# Patient Record
Sex: Male | Born: 2000 | Race: Black or African American | Hispanic: No | Marital: Single | Smoking: Never smoker
Health system: Southern US, Community
[De-identification: ages and names within clinical notes are randomized; demographics above are authoritative.]

## PROBLEM LIST (undated history)

## (undated) DIAGNOSIS — F909 Attention-deficit hyperactivity disorder, unspecified type: Secondary | ICD-10-CM

## (undated) HISTORY — PX: NO PAST SURGERIES: SHX2092

---
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
## 2023-03-31 IMAGING — CR DG WRIST COMPLETE 3+V*R*
4 series · 4 of 4 positions shown · non-contrast
Comparison: No pertinent prior exams available for comparison.

CLINICAL DATA: Pain and swelling after impact.

EXAM:
RIGHT WRIST - COMPLETE 3+ VIEW

[wrist pa]
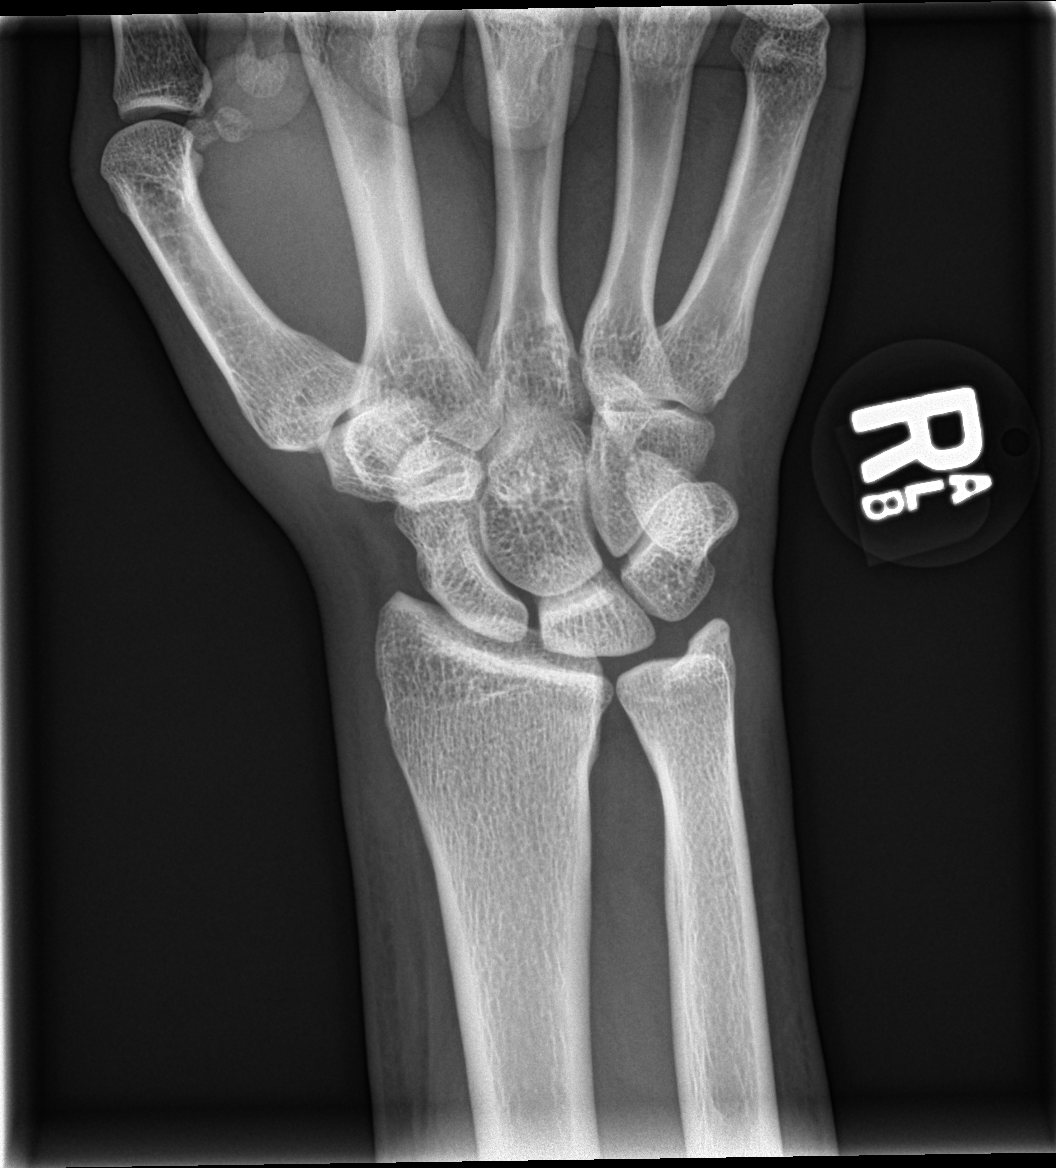

[wrist obl]
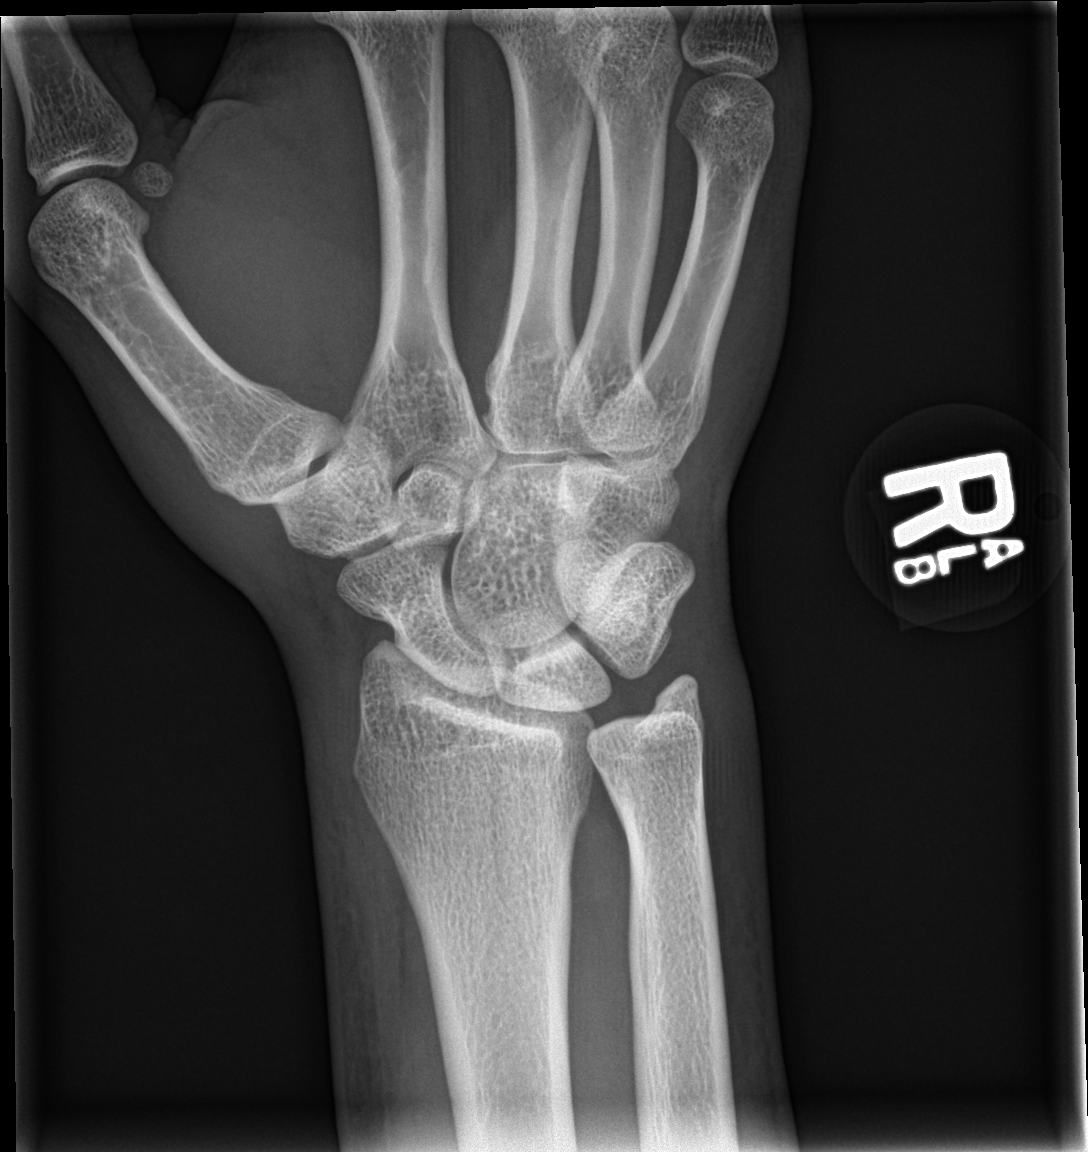

[wrist lat]
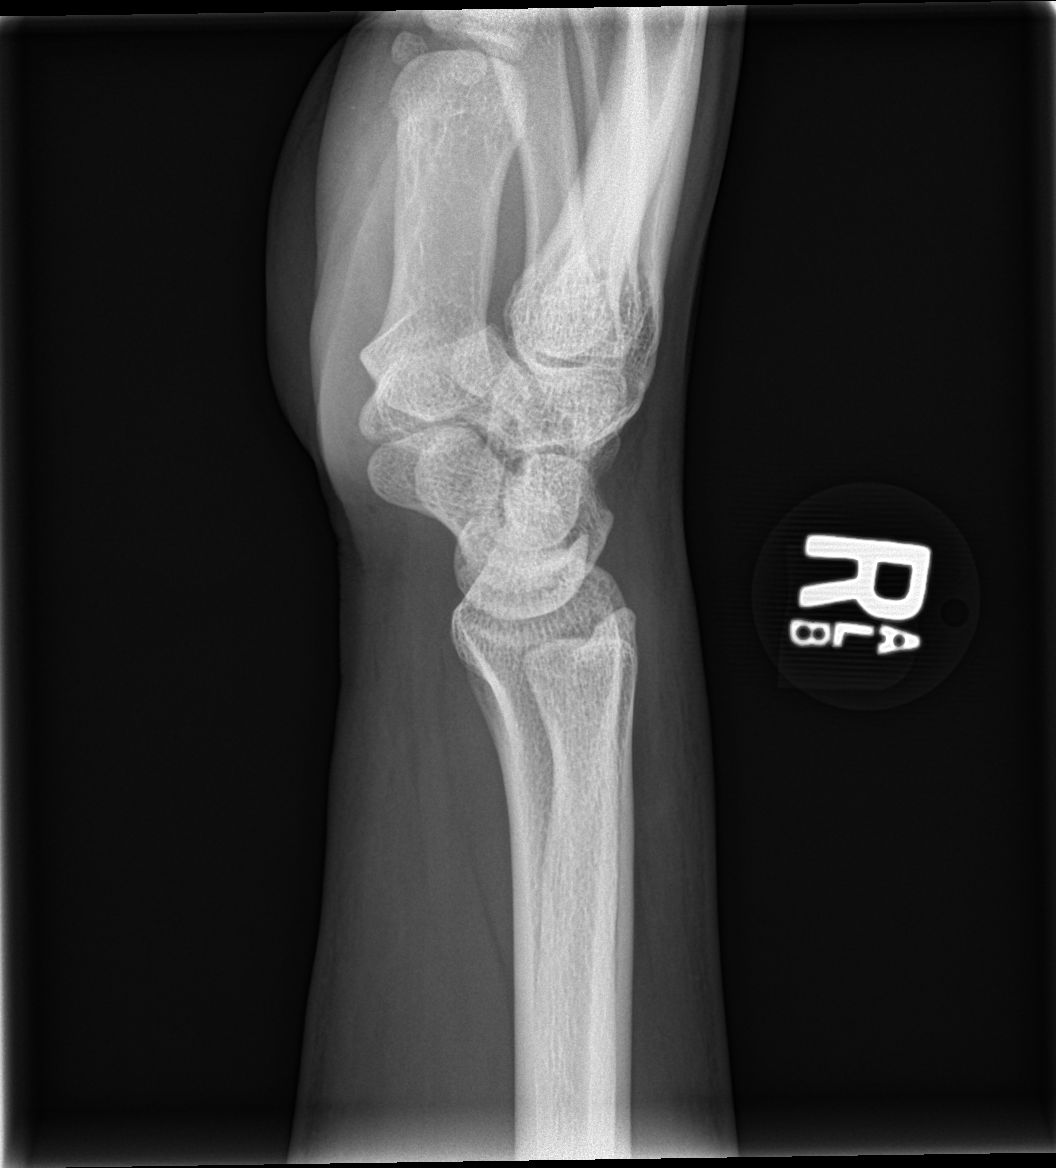

[wrist navicular]
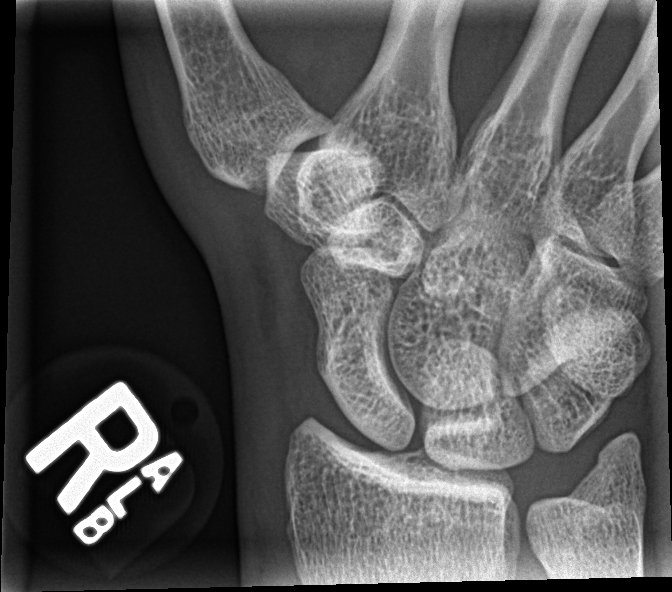

[4 of 4 positions shown; findings below may reference images not displayed]

FINDINGS: There is normal bony alignment.

No evidence of acute osseous or articular abnormality.

The joint spaces are maintained.
IMPRESSION: No evidence of acute osseous or articular abnormality.
